# Patient Record
Sex: Female | Born: 2005 | Race: Black or African American | Hispanic: No | Marital: Single | Smoking: Never smoker
Health system: Southern US, Community
[De-identification: ages and names within clinical notes are randomized; demographics above are authoritative.]

## PROBLEM LIST (undated history)

## (undated) DIAGNOSIS — F419 Anxiety disorder, unspecified: Secondary | ICD-10-CM

## (undated) DIAGNOSIS — Z789 Other specified health status: Secondary | ICD-10-CM

## (undated) HISTORY — DX: Anxiety disorder, unspecified: F41.9

## (undated) HISTORY — DX: Other specified health status: Z78.9

---
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
## 2023-01-15 ENCOUNTER — Ambulatory Visit (INDEPENDENT_AMBULATORY_CARE_PROVIDER_SITE_OTHER): Payer: Medicaid Other | Admitting: Internal Medicine

## 2023-01-15 ENCOUNTER — Encounter: Payer: Self-pay | Admitting: Internal Medicine

## 2023-01-15 VITALS — BP 136/66 | HR 88 | Resp 16 | Ht 62.0 in | Wt 184.0 lb

## 2023-01-15 DIAGNOSIS — F419 Anxiety disorder, unspecified: Secondary | ICD-10-CM

## 2023-01-15 DIAGNOSIS — F410 Panic disorder [episodic paroxysmal anxiety] without agoraphobia: Secondary | ICD-10-CM

## 2023-01-15 DIAGNOSIS — F319 Bipolar disorder, unspecified: Secondary | ICD-10-CM | POA: Insufficient documentation

## 2023-01-15 DIAGNOSIS — E66811 Obesity, class 1: Secondary | ICD-10-CM | POA: Insufficient documentation

## 2023-01-15 DIAGNOSIS — F32A Depression, unspecified: Secondary | ICD-10-CM

## 2023-01-15 NOTE — Patient Instructions (Signed)
 Drink a glass of water before every meal Drink 6-8 glasses of water daily Eat three meals daily Eat a protein and healthy fat with every meal (eggs,fish, chicken, [place] and limit red meats) Eat 5 servings of vegetables daily, mix the colors Eat 2 servings of fruit daily with skin, if skin is edible Use smaller plates Put food/utensils down as you chew and swallow each bite Eat at a table with friends/family at least once daily, no TV Do not eat in front of the TV  Recent studies show that people who consume all of their calories in a 12 hour period lose weight more efficiently.  For example, if you eat your first meal at 7:00 a.m., your last meal of the day should be completed by 7:00 p.m.

## 2023-01-15 NOTE — Progress Notes (Signed)
 Subjective:    Patient ID: Linda Hale, female   DOB: Feb 24, 2005, 18 y.o.   MRN: [id number]   HPI   Depression/anxiety, possibly bipolar disorder and Panic disorder:  Still depressed due to mother with intermittent work as a temp and need to pay rent or bills at times.  She also has a 69  and 20 yo younger half siblings.  The youngsters have support from their father and mother also focuses on them with funds as Rut previously was brought up mainly by her other mother, Lonia.   No definite suicidal thoughts and certainly no plan recently She has had since May intermittent episodes of high energy with no appetite and no need to sleep for a week, despite taking Seroquel , which most of time causes her to sleep despite not feeling need to do so.   Can spend too much money during these time periods.  Does not use her savings acct money. She has a boyfriend out of town ([place]), who is 58 yo.  Met through a friend.  She does not have sex with others during these time periods.  2.  Obesity:  has gained about 35 lbs since seen in May.  Stress eats, mainly at work:  chips, deli food.  Does not drink sodas.  Occasional juice.  Goes to work and comes home.  Works 1 to 10 pm.  Bed at 1 a.m.Sleeps until 11 a.m.  3.  Problems with periods:  Taking her BCP, but 2 cycles ago, period started late and then had very dark red clots for 2 days with cramping.  Last period with just a scant amount of bleeding.  First day of last period was end of December.    Current Meds  Medication Sig   cetirizine  (ZYRTEC ) 10 MG tablet Take 1 tablet (10 mg total) by mouth daily.   famotidine  (PEPCID ) 40 MG tablet 1 tab by mouth daily at bedtime   ibuprofen  (ADVIL ) 200 MG tablet Take 200 mg by mouth every 6 (six) hours as needed. 3 tabs by mouth every 6 hours as need   norgestimate -ethinyl estradiol  (ORTHO-CYCLEN, 28,) 0.25-35 MG-MCG tablet Take 1 tablet by mouth daily.   QUEtiapine  (SEROQUEL ) 25 MG tablet 2 tabs by  mouth at bedtime daily.   sertraline  (ZOLOFT ) 100 MG tablet TAKE 1 TABLEt by mouth daily   No Known Allergies   Review of Systems    Objective:   BP 136/66 (BP Location: Left Arm, Patient Position: Sitting, Cuff Size: Normal)   Pulse 88   Resp 16   Ht 5' 2 (1.575 m)   Wt 184 lb (83.5 kg)   LMP 12/30/2022 (Exact Date)   BMI 33.65 kg/m   Physical Exam NAD HEENT:  PERRL, EOMI Neck:  supple, No adenopathy Chest:  CTA CV:  RRR without murmur or rub.  Radial and DP pulses normal and equal.     Assessment & Plan   Likely bipolar disorder with both depressive symptoms and manic symptoms intermittently.  Continue current meds, but encouraged patient as in problem #2.  Warm hand off to DEMETRIOS Becton, MSW intern.  She can discuss with him working with SBT or himself.  2.  Obesity:  significant weight gain as no longer very physically active and not eating a healthy diet.  Also discussed meds from #1 could be factors in this as well.   Make goals for walking or going and doing with folks her age --music fests, going to parks, etc.  3.  Period irregularities:  discussed can have slight irregularities with period despite being on BCPs.  To call if worsens.  No concerns based on history.

## 2023-01-16 LAB — COMPREHENSIVE METABOLIC PANEL WITH GFR
ALT: 13 IU/L (ref 0–32)
AST: 14 IU/L (ref 0–40)
Albumin: 3.7 g/dL — ABNORMAL LOW (ref 4.0–5.0)
Alkaline Phosphatase: 44 IU/L (ref 42–106)
BUN/Creatinine Ratio: 10 (ref 9–23)
BUN: 8 mg/dL (ref 6–20)
Bilirubin Total: <0.2 mg/dL (ref 0.0–1.2)
CO2: 20 mmol/L (ref 20–29)
Calcium: 9.4 mg/dL (ref 8.7–10.2)
Chloride: 102 mmol/L (ref 96–106)
Creatinine, Ser: 0.77 mg/dL (ref 0.57–1.00)
Globulin, Total: 2.9 g/dL (ref 1.5–4.5)
Glucose: 95 mg/dL (ref 70–99)
Potassium: 4.7 mmol/L (ref 3.5–5.2)
Sodium: 137 mmol/L (ref 134–144)
Total Protein: 6.6 g/dL (ref 6.0–8.5)
eGFR: 114 mL/min/1.73

## 2023-01-16 LAB — LIPID PANEL W/O CHOL/HDL RATIO
Cholesterol, Total: 129 mg/dL (ref 100–169)
HDL: 53 mg/dL (ref >39)
LDL Chol Calc (NIH): 57 mg/dL (ref 0–109)
Triglycerides: 100 mg/dL — ABNORMAL HIGH (ref 0–89)
VLDL Cholesterol Cal: 19 mg/dL (ref 5–40)

## 2023-01-16 LAB — CBC WITH DIFFERENTIAL/PLATELET
Basophils Absolute: 0 x10E3/uL (ref 0.0–0.2)
Basos: 1 %
EOS (ABSOLUTE): 0.1 x10E3/uL (ref 0.0–0.4)
Eos: 2 %
Hematocrit: 37.5 % (ref 34.0–46.6)
Hemoglobin: 11.8 g/dL (ref 11.1–15.9)
Immature Grans (Abs): 0 x10E3/uL (ref 0.0–0.1)
Immature Granulocytes: 0 %
Lymphocytes Absolute: 2.4 x10E3/uL (ref 0.7–3.1)
Lymphs: 45 %
MCH: 24.8 pg — ABNORMAL LOW (ref 26.6–33.0)
MCHC: 31.5 g/dL (ref 31.5–35.7)
MCV: 79 fL (ref 79–97)
Monocytes Absolute: 0.3 x10E3/uL (ref 0.1–0.9)
Monocytes: 6 %
Neutrophils Absolute: 2.4 x10E3/uL (ref 1.4–7.0)
Neutrophils: 46 %
Platelets: 512 x10E3/uL — ABNORMAL HIGH (ref 150–450)
RBC: 4.76 x10E6/uL (ref 3.77–5.28)
RDW: 15.7 % — ABNORMAL HIGH (ref 11.7–15.4)
WBC: 5.2 x10E3/uL (ref 3.4–10.8)

## 2023-01-16 LAB — TSH: TSH: 3.11 u[IU]/mL (ref 0.450–4.500)

## 2023-01-27 ENCOUNTER — Other Ambulatory Visit: Payer: Self-pay | Admitting: Internal Medicine

## 2023-01-28 ENCOUNTER — Other Ambulatory Visit: Payer: Self-pay

## 2023-01-30 ENCOUNTER — Ambulatory Visit: Payer: Medicaid Other | Admitting: Psychology

## 2023-01-30 DIAGNOSIS — F319 Bipolar disorder, unspecified: Secondary | ICD-10-CM | POA: Diagnosis not present

## 2023-01-30 DIAGNOSIS — F419 Anxiety disorder, unspecified: Secondary | ICD-10-CM | POA: Diagnosis not present

## 2023-01-30 DIAGNOSIS — F32A Depression, unspecified: Secondary | ICD-10-CM | POA: Diagnosis not present

## 2023-01-30 DIAGNOSIS — F410 Panic disorder [episodic paroxysmal anxiety] without agoraphobia: Secondary | ICD-10-CM

## 2023-02-19 ENCOUNTER — Ambulatory Visit: Payer: Medicaid Other | Admitting: Psychology

## 2023-02-19 DIAGNOSIS — F319 Bipolar disorder, unspecified: Secondary | ICD-10-CM

## 2023-02-19 DIAGNOSIS — F410 Panic disorder [episodic paroxysmal anxiety] without agoraphobia: Secondary | ICD-10-CM | POA: Diagnosis not present

## 2023-02-19 DIAGNOSIS — F419 Anxiety disorder, unspecified: Secondary | ICD-10-CM

## 2023-02-19 DIAGNOSIS — F411 Generalized anxiety disorder: Secondary | ICD-10-CM | POA: Diagnosis not present

## 2023-03-05 ENCOUNTER — Other Ambulatory Visit: Payer: Self-pay

## 2023-03-05 ENCOUNTER — Ambulatory Visit: Payer: Medicaid Other | Admitting: Psychology

## 2023-03-05 DIAGNOSIS — F410 Panic disorder [episodic paroxysmal anxiety] without agoraphobia: Secondary | ICD-10-CM

## 2023-03-05 DIAGNOSIS — F319 Bipolar disorder, unspecified: Secondary | ICD-10-CM | POA: Diagnosis not present

## 2023-03-05 DIAGNOSIS — F411 Generalized anxiety disorder: Secondary | ICD-10-CM

## 2023-03-13 NOTE — Progress Notes (Signed)
 DAP Note Client name: Linda Hale Date: 01/30/2023 Time: 60 mins.  Data: Introduction session between client and supportive counselor. Client voiced on feeling "nervous". Consent and confidentiality was initially voiced in the session to create a trusting therapeutic space. Client has been diagnosed with Bipolar Disorder and is on medication for it. Client was sitting in an upward position. Maintain eye level contact throughout the session. Client had a dishevel appearance. She would stumbled over her words quite often.   Assessment: Supportive counselor conducted two assessments, the GAD-7 and PHQ-9 to assess her levels of anxiety and depression. Both levels were moderately high. Client is having trouble with personal and social relationships. 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 Client needs assistance with regulating her emotions.  Plan: Client will pick three goals she will like to work on. Introduction of grounding/mindfulness techniques will practiced in session and be assigned as homework. Potential introduction of the "Wise mind" concept will be introduced to balance rational and emotion mind.   Sherilyn Cooter, MSW student

## 2023-03-19 ENCOUNTER — Other Ambulatory Visit: Admitting: Psychology

## 2023-04-02 ENCOUNTER — Ambulatory Visit (INDEPENDENT_AMBULATORY_CARE_PROVIDER_SITE_OTHER): Admitting: Psychology

## 2023-04-02 DIAGNOSIS — F319 Bipolar disorder, unspecified: Secondary | ICD-10-CM | POA: Diagnosis not present

## 2023-04-02 DIAGNOSIS — F410 Panic disorder [episodic paroxysmal anxiety] without agoraphobia: Secondary | ICD-10-CM

## 2023-04-02 DIAGNOSIS — F411 Generalized anxiety disorder: Secondary | ICD-10-CM

## 2023-04-07 NOTE — Progress Notes (Signed)
 Comprehensive Clinical Assessment (CCA) Note  02/19/2023 Linda Hale [id number]  Chief Complaint: Anxiety Visit Diagnosis: Anxiety, panic disorder, Bi-polar 1   CCA Screening, Triage and Referral (STR)  Patient Reported Information How did you hear about [place]? Patient at [facility]  Referral Name: Linda Hale Referral phone number:   Whom do you see for routine medical problems? Linda Hale  Practice/Facility Name: [facility]  Practice/Facility Phone Number: [phone] Name of Contact: Linda Hale  Contact Number: [phone] Contact Fax Number:  Prescriber Name: Linda Hale Prescriber Address (if known): [address]  What Is the Reason for Your Visit/Call Today? Supportive Counseling/Therapy How Long Has This Been Causing You Problems? Quite a while What Do You Feel Would Help You the Most Today? Mental Health  Have You Recently Been in Any Inpatient Treatment (Hospital/Detox/Crisis Center/28-Day Program)? N/A Name/Location of Program/Hospital:N/A How Long Were You There? N/A When Were You Discharged? N/A  Have You Ever Received Services From [facility] Before? N/A Who Do You See at [facility]? N/A  Have You Recently Had Any Thoughts About Hurting Yourself? No negative thoughts are present at the moment Are You Planning to Commit Suicide/Harm Yourself At This time? Client has been assessed and not show any signs of committing suicide.  Have you Recently Had Thoughts About Hurting Someone Linda Hale? She does not Explanation: N/A  Have You Used Any Alcohol or Drugs in the Past 24 Hours? She has not How Long Ago Did You Use Drugs or Alcohol? Socially What Did You Use and How Much? Cannot recall at the moment.  Do You Currently Have a Therapist/Psychiatrist? Yes Name of Therapist/Psychiatrist: [facility] [employer] MSW Hale, Linda Hale  Have You Been Recently Discharged  From Any Office Practice or Programs? N/A Explanation of Discharge From Practice/Program: N/A    CCA Screening Triage Referral Assessment Type of Contact: In person Is this Initial or Reassessment? Initial Date Telepsych consult ordered in [facility]:  02/19/2023 Time Telepsych consult ordered in [facility]:  N/A  Patient Reported Information Reviewed? Yes Patient Left Without Being Seen? No Reason for Not Completing Assessment: Completed  Collateral Involvement: N/A  Does Patient Have a Court Appointed Legal Guardian? No Name and Contact of Legal Guardian: N/A If Minor and Not Living with Parent(s), Who has Custody? N/A Is CPS involved or ever been involved? N/A Is APS involved or ever been involved? No  Patient Determined To Be At Risk for Harm To Self or Others Based on Review of Patient Reported Information or Presenting Complaint? She is not presenting any complaints Method: N/A Availability of Means: N/A Intent: N/A Notification Required: N/A Additional Information for Danger to Others Potential: N/A Additional Comments for Danger to Others Potential: N/A Are There Guns or Other Weapons in Your Home?  Types of Guns/Weapons: N/A Are These Weapons Safely Secured? N/A Who Could Verify You Are Able To Have These Secured: N/A Do You Have any Outstanding Charges, Pending Court Dates, Parole/Probation?  Client does not have any outstanding charges, pending court dates, parole/probation Contacted To Inform of Risk of Harm To Self or Others: N/A  Location of Assessment: [place] [employer]   Does Patient Present under Involuntary Commitment? Patient does not present involuntary commitment  IVC Papers Initial File Date: N/A  [place] of Residence: [place]   Patient Currently Receiving the Following Services: Primary Care, Therapeutic Services  Determination of Need: Supportive counseling and potential case management   Options For Referral:  n/a    CCA  Biopsychosocial Intake/Chief Complaint:  Overwhelmed, low level of depression, high level of anxiety Current Symptoms/Problems: anxious, distraught, on the edge  Patient Reported Schizophrenia/Schizoaffective Diagnosis in Past: n/a  Strengths: Insightful, optimistic, confident. And hardworking Preferences:  Abilities: mobile, task oriented,   Type of Services Patient Feels are Needed: Therapy   Initial Clinical Notes/Concerns: According to primary care, client has gained a significant amount of weight. Mental Health Symptoms Depression: Mild level  Duration of Depressive symptoms:   Mania:    Anxiety:   GAD Score:11  Psychosis:  No data recorded  Duration of Psychotic symptoms: No data recorded  Trauma:  Poverty   Obsessions:  Savior Complex  Compulsions:  No data recorded  Inattention:  No data recorded  Hyperactivity/Impulsivity:  No data recorded  Oppositional/Defiant Behaviors: n/a  Emotional Irregularity:  No data recorded  Other Mood/Personality Symptoms:  No data recorded   Mental Status Exam Appearance and self-care  Stature:  No data recorded  Weight:  overweight  Clothing:  clean and maintained   Grooming: unkept  Cosmetic use:  none  Posture/gait:  semi downward  Motor activity:  normal  Sensorium  Attention:  Normal  Concentration:  Normal  Orientation:  Alert  Recall/memory:  Normal  Affect and Mood  Affect:  engaging affect  Mood:  Mellow  Relating  Eye contact:  eye level  Facial expression:  Normal  Attitude toward examiner:  Engaging  Thought and Language  Speech flow: rapid  Thought content:  Collective  Preoccupation:  none  Hallucinations:  none  Organization:  Normal  Company secretary of Knowledge:  Normal but insightful   Intelligence:  Designer, industrial/product:  Normal  Judgement:  Immature  Reality Testing:  Normal  Insight: High  Decision Making:  Normal  Social Functioning  Social Maturity:  Immature  Social  Judgement:  Normal  Stress  Stressors:  Financial insecurity   Coping Ability:  Normal   Skill Deficits:  None  Supports:  partner, medical care    Religion:  Not known  Leisure/Recreation:  Not know at the moment  Exercise/Diet:  Establishing plan at the moment   CCA Employment/Education Employment/Work Situation:  Armed forces operational officer at [employer]  Education:  [employer] Diploma   CCA Family/Childhood History Family and Relationship History:  Primary Caregiver-Godmother Futures trader    Childhood History: Godmother became primary caregiver due to mother's mental instability at the time. She lived in poverty, but all basic needs were met. Client voiced she felt emotional needs were not met.     Child/Adolescent Assessment:  Basic needs were met. Emotional needs were unmet according to client.    CCA Substance Use Alcohol/Drug Use: Recreational (socially)                           ASAM's:  Six Dimensions of Multidimensional Assessment  Dimension 1:  Acute Intoxication and/or Withdrawal Potential:      Dimension 2:  Biomedical Conditions and Complications:      Dimension 3:  Emotional, Behavioral, or Cognitive Conditions and Complications:     Dimension 4:  Readiness to Change:     Dimension 5:  Relapse, Continued use, or Continued Problem Potential:     Dimension 6:  Recovery/Living Environment:     ASAM Severity Score:    ASAM Recommended Level of Treatment:     Substance use Disorder (SUD)    Recommendations for Services/Supports/Treatments:  Supportive counseling/Therapy biweekly  Continue medication   DSM5 Diagnoses: Patient Active Problem List   Diagnosis Date Noted   Obesity (BMI 30.0-34.9) 01/15/2023   Bipolar 1 disorder (HCC) 01/15/2023   Cervicitis 06/14/2022   Panic disorder 10/27/2021   Menorrhagia with regular cycle 05/21/2021   Gastroesophageal reflux disease without esophagitis 05/13/2021   Anxiety and depression 05/13/2021    Acne vulgaris 05/13/2021   Dysmenorrhea 05/13/2021   Seasonal allergies 05/13/2021   Parent-child conflict 01/22/2015    Patient Centered Plan: Patient is on the following Treatment Plan(s):   GAD, Panic disorder, Bipolar 1 disorder  Referrals to Alternative Service(s): Referred to Alternative Service(s):   Place:   Date:   Time:    Referred to Alternative Service(s):   Place:   Date:   Time:    Referred to Alternative Service(s):   Place:   Date:   Time:    Referred to Alternative Service(s):   Place:   Date:   Time:      Collaboration of Care:   Patient/Guardian was advised Release of Information must be obtained prior to any record release in order to collaborate their care with an outside provider. Patient/Guardian was advised if they have not already done so to contact the registration department to sign all necessary forms in order for [place] to release information regarding their care.   Consent: Patient/Guardian gives verbal consent for treatment and assignment of benefits for services provided during this visit. Patient/Guardian expressed understanding and agreed to proceed.   Sherilyn Cooter, MSW student

## 2023-05-03 ENCOUNTER — Other Ambulatory Visit: Payer: Self-pay | Admitting: Internal Medicine

## 2023-05-18 ENCOUNTER — Other Ambulatory Visit: Payer: Self-pay | Admitting: Internal Medicine

## 2023-05-21 ENCOUNTER — Ambulatory Visit: Admitting: Psychology

## 2023-05-21 DIAGNOSIS — F319 Bipolar disorder, unspecified: Secondary | ICD-10-CM

## 2023-05-21 DIAGNOSIS — F410 Panic disorder [episodic paroxysmal anxiety] without agoraphobia: Secondary | ICD-10-CM

## 2023-05-21 DIAGNOSIS — F411 Generalized anxiety disorder: Secondary | ICD-10-CM

## 2023-05-25 ENCOUNTER — Telehealth: Payer: Self-pay

## 2023-05-25 NOTE — Telephone Encounter (Signed)
 Patient called today to ask status of medication Quetiapine .  Patient states that she was notify by nurse that medication had to be approved by patient's insurance.  Patient called today to ask if nurse knows if medication was approved or not.  Patient states she has been waiting for 3 weeks and stated she can not be with out that medication for long.

## 2023-05-27 NOTE — Progress Notes (Signed)
 DAP Note  D-Data Client attended session and reported that she had an argument with her partner due to miscommunication. She shared that while she still experiences high levels of anxiety and occasional episodes of depression. Client described the situation with her partner where she advocated for him, and felt that she needed to help and save him. She reported feeling "responsible" for the mental health of her partner.  A-Assessment Client appear's emotionally exhausted and enmeshed in a care giving role that may be reinforcing her depressive and anxious symptoms , insight into rational dynamics is emerging, but boundaries remain diffuse. Bipolar symptoms appear relatively stable, with no current signs of mania or hypomania; how ever reported experiencing a depressive phase. Anxiety seems to be exacerbated by perceived relational obligations and lack of self-care. Client remains engaged in therapy and open to exploring alternative coping strategies.  P-Plan Continue exploring relational boundaries and care giving dynamics in upcoming sessions. Introduce psychoeducation or codependency and emotional labor. Reinforce coping strategies for managing anxiety (e.g. ground techniques). Monitor mood symptoms closely for shifts indicating bipolar cycling.

## 2023-05-28 ENCOUNTER — Telehealth: Payer: Self-pay

## 2023-05-28 NOTE — Telephone Encounter (Signed)
 Left a message to tell patient approval of medication.

## 2023-05-28 NOTE — Telephone Encounter (Signed)
 Medication approved

## 2023-06-02 NOTE — Telephone Encounter (Signed)
 Patient aware medication approval

## 2023-06-11 ENCOUNTER — Other Ambulatory Visit: Admitting: Psychology

## 2023-06-22 NOTE — Progress Notes (Unsigned)
 DAP Note  D-Data Client reported actively practicing coping skills learned in therapy across both personal (micro) and community/workplace (macro) settings. She shared examples of using grounding techniques during stressful family interactions and utilizing deep breathing and self-talk in high pressure work situations. Client also discussed her ongoing efforts, to set and maintain boundaries with peers, colleagues, and family members. She identified feeling more confident when asserting her needs and noted an improvement in how other respond to her limits.   A-Assessment  Client is demonstrating consistent application of therapeutic tools outside of sessions, indicating integration of coping strategies into her daily life. Client appears motivated to continue building these skills and is showing progress toward her goals of increased autonomy and decreased emotional reactivity in challenging situations.  P-Plan Continue reinforcing effective coping strategies and boundary setting tools Support client in identifying strengths and reflecting on growth across environments.   Linda Hale, MSW Intern

## 2023-07-02 ENCOUNTER — Other Ambulatory Visit: Admitting: Psychology

## 2023-07-16 ENCOUNTER — Encounter: Payer: Self-pay | Admitting: Internal Medicine

## 2023-07-16 ENCOUNTER — Ambulatory Visit: Payer: Medicaid Other | Admitting: Internal Medicine

## 2023-07-16 VITALS — BP 110/70 | HR 92 | Resp 14 | Ht 62.0 in | Wt 188.0 lb

## 2023-07-16 DIAGNOSIS — E66811 Obesity, class 1: Secondary | ICD-10-CM

## 2023-07-16 DIAGNOSIS — N920 Excessive and frequent menstruation with regular cycle: Secondary | ICD-10-CM

## 2023-07-16 DIAGNOSIS — F411 Generalized anxiety disorder: Secondary | ICD-10-CM

## 2023-07-16 DIAGNOSIS — N946 Dysmenorrhea, unspecified: Secondary | ICD-10-CM

## 2023-07-16 NOTE — Progress Notes (Unsigned)
    Subjective:    Patient ID: Linda Hale, female   DOB: 2005/05/16, 18 y.o.   MRN: [id number]   HPI   Fatigued:  first day off after an entire week.  Works 8 hours from late morning to about 9 p.m.  She is now living with her boyfriend since last seen here in January.  She is not happy in her job.   She stopped her Sertraline  some time ago--felt it made her more moody.   Describes supporting her boyfriend--severe eczema and then also has depressive issues with the eczema.    2.  Obesity:  Not physically active  First meal is at 9 am:  Sandwich with whole wheat bread.  [place] and cheese.  Water  Takes bus to [employer] on [address].    3 p.m. is next meal:  similar sandwich or leftovers.  May get yogurt.  Water  Home at 10 pm to 11 p.m.:  spaghetti or chicken nuggets.  Water.    Bedtime at 12 to 1 am.    Current Meds  Medication Sig  . famotidine  (PEPCID ) 40 MG tablet TAKE 1 TABLET BY MOUTH DAILY AT BEDTIME  . ibuprofen  (ADVIL ) 200 MG tablet Take 200 mg by mouth every 6 (six) hours as needed. 3 tabs by mouth every 6 hours as need  . MILI 0.25-35 MG-MCG tablet TAKE 1 TABLET BY MOUTH DAILY  . QUEtiapine  (SEROQUEL ) 25 MG tablet TAKE 2 TABLETS BY MOUTH DAILY AT BEDTIME   No Known Allergies   Review of Systems    Objective:   BP 110/70 (BP Location: Right Arm, Patient Position: Sitting, Cuff Size: Normal)   Pulse 92   Resp 14   Ht 5' 2 (1.575 m)   Wt 188 lb (85.3 kg)   LMP 06/22/2023 (Exact Date)   BMI 34.39 kg/m   Physical Exam   Assessment & Plan  Needs bicycle.

## 2023-07-16 NOTE — Patient Instructions (Addendum)
 [facility] [employer] Address [phone]  [employer] Hours Repair - Drop in and use a work stand and tools to work on your bike. The mechanic will be on site.  First come/first serve as space allows.  Sundays 1 pm to 4 pm  Contact Info [phone] (text only) [email or web address]   Check into [facility].

## 2023-08-25 NOTE — Progress Notes (Signed)
 DAP Note  Data: Client attended scheduled session. She shared ongoing struggles in her relationship with her partner and exploded patterns of feeling responsible for saving others. Client was open and reflective during discussion.  Assessment: Client continues to demonstrate tendencies related to a savior complex, though she is showing progress by recognizing these patterns and building insight. She is making efforts to practice self-compassion, focusing on being kinder to her mind and body. Overall presentation suggests growth in self-awareness and readiness for change.   Plan: Support client in deepening exploration of relational dynamics and reinforcing boundaries. Continue to encourage self-care practices and mindfulness to strengthen her ability to prioritize her own wellbeing. Plan to introduce strategies for balancing empathy with self- protection in future sessions.   Irving Becton, MSW Intern

## 2024-01-01 ENCOUNTER — Other Ambulatory Visit: Payer: Self-pay | Admitting: Internal Medicine

## 2024-01-04 ENCOUNTER — Other Ambulatory Visit: Payer: Self-pay | Admitting: Internal Medicine

## 2024-01-04 MED ORDER — NORGESTIMATE-ETH ESTRADIOL 0.25-35 MG-MCG PO TABS: 1.0000 | ORAL_TABLET | Freq: Every day | ORAL | 2 refills | Status: AC

## 2024-02-05 ENCOUNTER — Other Ambulatory Visit (INDEPENDENT_AMBULATORY_CARE_PROVIDER_SITE_OTHER)

## 2024-02-05 DIAGNOSIS — E66811 Obesity, class 1: Secondary | ICD-10-CM

## 2024-02-05 DIAGNOSIS — Z79899 Other long term (current) drug therapy: Secondary | ICD-10-CM

## 2024-02-05 DIAGNOSIS — D75839 Thrombocytosis, unspecified: Secondary | ICD-10-CM | POA: Diagnosis not present

## 2024-02-05 DIAGNOSIS — E781 Pure hyperglyceridemia: Secondary | ICD-10-CM

## 2024-02-05 NOTE — Addendum Note (Signed)
 Addended by: ADELLA ALMARIE HERO on: 02/05/2024 09:49 AM   Modules accepted: Orders

## 2024-02-06 LAB — CBC WITH DIFFERENTIAL/PLATELET
Basophils Absolute: 0.1 10*3/uL (ref 0.0–0.2)
Basos: 1 %
EOS (ABSOLUTE): 0.2 10*3/uL (ref 0.0–0.4)
Eos: 2 %
Hematocrit: 41.3 % (ref 34.0–46.6)
Hemoglobin: 13.3 g/dL (ref 11.1–15.9)
Immature Grans (Abs): 0 10*3/uL (ref 0.0–0.1)
Immature Granulocytes: 0 %
Lymphocytes Absolute: 4.6 10*3/uL — ABNORMAL HIGH (ref 0.7–3.1)
Lymphs: 57 %
MCH: 25.7 pg — ABNORMAL LOW (ref 26.6–33.0)
MCHC: 32.2 g/dL (ref 31.5–35.7)
MCV: 80 fL (ref 79–97)
Monocytes Absolute: 0.4 10*3/uL (ref 0.1–0.9)
Monocytes: 5 %
Neutrophils Absolute: 2.8 10*3/uL (ref 1.4–7.0)
Neutrophils: 35 %
Platelets: 490 10*3/uL — ABNORMAL HIGH (ref 150–450)
RBC: 5.17 x10E6/uL (ref 3.77–5.28)
RDW: 15.2 % (ref 11.7–15.4)
WBC: 8 10*3/uL (ref 3.4–10.8)

## 2024-02-06 LAB — COMPREHENSIVE METABOLIC PANEL WITH GFR
ALT: 46 [IU]/L — ABNORMAL HIGH (ref 0–32)
AST: 31 [IU]/L (ref 0–40)
Albumin: 3.9 g/dL — ABNORMAL LOW (ref 4.0–5.0)
Alkaline Phosphatase: 61 [IU]/L (ref 42–106)
BUN/Creatinine Ratio: 11 (ref 9–23)
BUN: 9 mg/dL (ref 6–20)
Bilirubin Total: 0.2 mg/dL (ref 0.0–1.2)
CO2: 22 mmol/L (ref 20–29)
Calcium: 9.7 mg/dL (ref 8.7–10.2)
Chloride: 101 mmol/L (ref 96–106)
Creatinine, Ser: 0.8 mg/dL (ref 0.57–1.00)
Globulin, Total: 3.2 g/dL (ref 1.5–4.5)
Glucose: 104 mg/dL — ABNORMAL HIGH (ref 70–99)
Potassium: 4.5 mmol/L (ref 3.5–5.2)
Sodium: 139 mmol/L (ref 134–144)
Total Protein: 7.1 g/dL (ref 6.0–8.5)
eGFR: 108 mL/min/{1.73_m2}

## 2024-02-06 LAB — LIPID PANEL W/O CHOL/HDL RATIO
Cholesterol, Total: 187 mg/dL (ref 100–199)
HDL: 60 mg/dL
LDL Chol Calc (NIH): 89 mg/dL (ref 0–99)
Triglycerides: 227 mg/dL — ABNORMAL HIGH (ref 0–149)
VLDL Cholesterol Cal: 38 mg/dL (ref 5–40)

## 2024-02-06 LAB — HGB A1C W/O EAG: Hgb A1c MFr Bld: 5.8 % — ABNORMAL HIGH (ref 4.8–5.6)

## 2024-02-11 ENCOUNTER — Encounter: Payer: Self-pay | Admitting: Internal Medicine

## 2024-02-11 ENCOUNTER — Ambulatory Visit: Admitting: Internal Medicine

## 2024-02-11 VITALS — BP 130/80 | HR 76 | Resp 16 | Ht 62.0 in | Wt 198.0 lb

## 2024-02-11 DIAGNOSIS — E669 Obesity, unspecified: Secondary | ICD-10-CM

## 2024-02-11 DIAGNOSIS — Z5941 Food insecurity: Secondary | ICD-10-CM

## 2024-02-11 DIAGNOSIS — E781 Pure hyperglyceridemia: Secondary | ICD-10-CM | POA: Insufficient documentation

## 2024-02-11 DIAGNOSIS — Z0000 Encounter for general adult medical examination without abnormal findings: Secondary | ICD-10-CM

## 2024-02-11 DIAGNOSIS — R7303 Prediabetes: Secondary | ICD-10-CM | POA: Insufficient documentation

## 2024-02-11 MED ORDER — VITAMIN D3 25 MCG (1000 UT) PO CAPS: ORAL_CAPSULE | ORAL | Status: AC

## 2024-02-11 MED ORDER — CALCIUM CITRATE 250 MG PO TABS: ORAL_TABLET | ORAL | Status: AC

## 2024-02-11 MED ORDER — QUETIAPINE FUMARATE 50 MG PO TABS: 50.0000 mg | ORAL_TABLET | Freq: Every day | ORAL | 11 refills | Status: AC

## 2024-02-11 NOTE — Progress Notes (Unsigned)
" ° ° ° °  Subjective:    Patient ID: Linda Hale, female   DOB: 2005-03-10, 19 y.o.   MRN: [id number]   HPI  CPE without pap  1.  Pap:  Never.    2.  Mammogram:  never.  No family history of breast cancer.    3.  Osteoprevention:  No milk products.  Not clear she is interested in cheese or yogurt.    4.  Guaiac Cards/FIT:  never.    5.  Colonoscopy:  Never.  No family history of colon cancer  6.  Immunizations:  Never received 2nd Meningo B vaccine.  Has not received influenza or COVID this year. Immunization History  Administered Date(s) Administered   DTaP 11/03/2003, 01/02/2004, 03/19/2004, 11/25/2006, 02/05/2009   Dtap, Unspecified 11/25/2006   HIB (PRP-OMP) 11/03/2003, 01/02/2004, 09/17/2004   HPV 9-valent 01/22/2015, 11/04/2016   Hepatitis A 11/25/2006, 02/05/2009   Hepatitis B 11/03/2003, 01/02/2004, 09/17/2004   IPV 11/03/2003, 01/02/2004, 03/19/2004, 02/05/2009   Influenza,inj,Quad PF,6+ Mos 01/22/2015, 11/04/2016   MMR 09/17/2004, 02/05/2009   Meningococcal B Recombinant 06/13/2021   Meningococcal Conjugate 01/22/2015   Meningococcal Mcv4o 06/13/2021   Pneumococcal-Unspecified 11/03/2003, 01/02/2004, 03/19/2004   Tdap 01/22/2015   Varicella 09/17/2004, 02/05/2009     7.  Glucose/Cholesterol:  Has developed prediabetes  Current Meds  Medication Sig   famotidine  (PEPCID ) 40 MG tablet TAKE 1 TABLET BY MOUTH DAILY AT BEDTIME   ibuprofen  (ADVIL ) 200 MG tablet Take 200 mg by mouth every 6 (six) hours as needed. 3 tabs by mouth every 6 hours as need   norgestimate -ethinyl estradiol  (ESTARYLLA) 0.25-35 MG-MCG tablet Take 1 tablet by mouth daily.   QUEtiapine  (SEROQUEL ) 25 MG tablet TAKE 2 TABLETS BY MOUTH DAILY AT BEDTIME   No Known Allergies   Review of Systems  Psychiatric/Behavioral:  Positive for dysphoric mood (no interest in counseling.  No longer working). The patient is nervous/anxious.        May consider working wit [facility], L[facility]WA--she will let me know.    Stopped Sertraline  about a year ago as made her irritable    Objective:   BP 130/80   Pulse 76   Resp 16   Ht 5' 2 (1.575 m)   Wt 198 lb (89.8 kg)   LMP 02/03/2024   BMI 36.21 kg/m   Physical Exam Chest:  Breasts:    Right: No mass, nipple discharge or skin change.     Left: No mass, nipple discharge or skin change.  Genitourinary:    Comments: Deferred     Assessment & Plan   "

## 2024-02-11 NOTE — Patient Instructions (Signed)
 Drink a glass of water before every meal Drink 6-8 glasses of water daily Eat three meals daily Eat a protein and healthy fat with every meal (eggs,fish, chicken, [place] and limit red meats) Eat 5 servings of vegetables daily, mix the colors Eat 2 servings of fruit daily with skin, if skin is edible Use smaller plates Put food/utensils down as you chew and swallow each bite Eat at a table with friends/family at least once daily, no TV Do not eat in front of the TV  Recent studies show that people who consume all of their calories in a 12 hour period lose weight more efficiently.  For example, if you eat your first meal at 7:00 a.m., your last meal of the day should be completed by 7:00 p.m.

## 2024-05-13 ENCOUNTER — Other Ambulatory Visit

## 2024-05-17 ENCOUNTER — Ambulatory Visit: Admitting: Internal Medicine
